# Patient Record
Sex: Female | Born: 1995 | Race: Black or African American | Hispanic: No | Marital: Single | Smoking: Never smoker
Health system: Southern US, Community
[De-identification: ages and names within clinical notes are randomized; demographics above are authoritative.]

---

## 2014-07-09 ENCOUNTER — Emergency Department (HOSPITAL_COMMUNITY)
Admission: EM | Admit: 2014-07-09 | Discharge: 2014-07-09 | Disposition: A | Discharge status: Home / Self Care | Payer: BC Managed Care – PPO | Attending: Emergency Medicine | Admitting: Emergency Medicine

## 2014-07-09 ENCOUNTER — Encounter (HOSPITAL_COMMUNITY): Payer: Self-pay | Admitting: Emergency Medicine

## 2014-07-09 ENCOUNTER — Emergency Department (HOSPITAL_COMMUNITY): Payer: BC Managed Care – PPO

## 2014-07-09 DIAGNOSIS — S161XXA Strain of muscle, fascia and tendon at neck level, initial encounter: Secondary | ICD-10-CM | POA: Diagnosis not present

## 2014-07-09 DIAGNOSIS — Y92410 Unspecified street and highway as the place of occurrence of the external cause: Secondary | ICD-10-CM | POA: Insufficient documentation

## 2014-07-09 DIAGNOSIS — Y9389 Activity, other specified: Secondary | ICD-10-CM | POA: Insufficient documentation

## 2014-07-09 DIAGNOSIS — S39012A Strain of muscle, fascia and tendon of lower back, initial encounter: Secondary | ICD-10-CM

## 2014-07-09 DIAGNOSIS — S199XXA Unspecified injury of neck, initial encounter: Secondary | ICD-10-CM | POA: Diagnosis present

## 2014-07-09 MED ORDER — HYDROCODONE-ACETAMINOPHEN 5-325 MG PO TABS: 2.0000 | ORAL_TABLET | ORAL | Status: AC | PRN

## 2014-07-09 MED ORDER — METAXALONE 800 MG PO TABS: 800.0000 mg | ORAL_TABLET | Freq: Three times a day (TID) | ORAL | Status: AC | PRN

## 2014-07-09 MED ORDER — IBUPROFEN 600 MG PO TABS: 600.0000 mg | ORAL_TABLET | Freq: Four times a day (QID) | ORAL | Status: AC | PRN
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
## 2014-07-09 NOTE — Discharge Instructions (Signed)
° °Cervical Sprain °A cervical sprain is an injury in the neck in which the strong, fibrous tissues (ligaments) that connect your neck bones stretch or tear. Cervical sprains can range from mild to severe. Severe cervical sprains can cause the neck vertebrae to be unstable. This can lead to damage of the spinal cord and can result in serious nervous system problems. The amount of time it takes for a cervical sprain to get better depends on the cause and extent of the injury. Most cervical sprains heal in 1 to 3 weeks. °CAUSES  °Severe cervical sprains may be caused by:  °· Contact sport injuries (such as from football, rugby, wrestling, hockey, auto racing, gymnastics, diving, martial arts, or boxing).   °· Motor vehicle collisions.   °· Whiplash injuries. This is an injury from a sudden forward and backward whipping movement of the head and neck.  °· Falls.   °Mild cervical sprains may be caused by:  °· Being in an awkward position, such as while cradling a telephone between your ear and shoulder.   °· Sitting in a chair that does not offer proper support.   °· Working at a poorly designed computer station.   °· Looking up or down for long periods of time.   °SYMPTOMS  °· Pain, soreness, stiffness, or a burning sensation in the front, back, or sides of the neck. This discomfort may develop immediately after the injury or slowly, 24 hours or more after the injury.   °· Pain or tenderness directly in the middle of the back of the neck.   °· Shoulder or upper back pain.   °· Limited ability to move the neck.   °· Headache.   °· Dizziness.   °· Weakness, numbness, or tingling in the hands or arms.   °· Muscle spasms.   °· Difficulty swallowing or chewing.   °· Tenderness and swelling of the neck.   °DIAGNOSIS  °Most of the time your health care provider can diagnose a cervical sprain by taking your history and doing a physical exam. Your health care provider will ask about previous neck injuries and any known neck  problems, such as arthritis in the neck. X-rays may be taken to find out if there are any other problems, such as with the bones of the neck. Other tests, such as a CT scan or MRI, may also be needed.  °TREATMENT  °Treatment depends on the severity of the cervical sprain. Mild sprains can be treated with rest, keeping the neck in place (immobilization), and pain medicines. Severe cervical sprains are immediately immobilized. Further treatment is done to help with pain, muscle spasms, and other symptoms and may include: °· Medicines, such as pain relievers, numbing medicines, or muscle relaxants.   °· Physical therapy. This may involve stretching exercises, strengthening exercises, and posture training. Exercises and improved posture can help stabilize the neck, strengthen muscles, and help stop symptoms from returning.   °HOME CARE INSTRUCTIONS  °· Put ice on the injured area.   °· Put ice in a plastic bag.   °· Place a towel between your skin and the bag.   °· Leave the ice on for 15-20 minutes, 3-4 times a day.   °· If your injury was severe, you may have been given a cervical collar to wear. A cervical collar is a two-piece collar designed to keep your neck from moving while it heals. °· Do not remove the collar unless instructed by your health care provider. °· If you have long hair, keep it outside of the collar. °· Ask your health care provider before making any adjustments to your collar.   Minor adjustments may be required over time to improve comfort and reduce pressure on your chin or on the back of your head. °· If you are allowed to remove the collar for cleaning or bathing, follow your health care provider's instructions on how to do so safely. °· Keep your collar clean by wiping it with mild soap and water and drying it completely. If the collar you have been given includes removable pads, remove them every 1-2 days and hand wash them with soap and water. Allow them to air dry. They should be completely  dry before you wear them in the collar. °· If you are allowed to remove the collar for cleaning and bathing, wash and dry the skin of your neck. Check your skin for irritation or sores. If you see any, tell your health care provider. °· Do not drive while wearing the collar.   °· Only take over-the-counter or prescription medicines for pain, discomfort, or fever as directed by your health care provider.   °· Keep all follow-up appointments as directed by your health care provider.   °· Keep all physical therapy appointments as directed by your health care provider.   °· Make any needed adjustments to your workstation to promote good posture.   °· Avoid positions and activities that make your symptoms worse.   °· Warm up and stretch before being active to help prevent problems.   °SEEK MEDICAL CARE IF:  °· Your pain is not controlled with medicine.   °· You are unable to decrease your pain medicine over time as planned.   °· Your activity level is not improving as expected.   °SEEK IMMEDIATE MEDICAL CARE IF:  °· You develop any bleeding. °· You develop stomach upset. °· You have signs of an allergic reaction to your medicine.   °· Your symptoms get worse.   °· You develop new, unexplained symptoms.   °· You have numbness, tingling, weakness, or paralysis in any part of your body.   °MAKE SURE YOU:  °· Understand these instructions. °· Will watch your condition. °· Will get help right away if you are not doing well or get worse. °Document Released: 06/20/2007 Document Revised: 08/28/2013 Document Reviewed: 02/28/2013 °[employer]® Patient Information ©2015 [employer]. This information is not intended to replace advice given to you by your health care provider. Make sure you discuss any questions you have with your health care provider. °Lumbosacral Strain °Lumbosacral strain is a strain of any of the parts that make up your lumbosacral vertebrae. Your lumbosacral vertebrae are the bones that make up the lower third of  your backbone. Your lumbosacral vertebrae are held together by muscles and tough, fibrous tissue (ligaments).  °CAUSES  °A sudden blow to your back can cause lumbosacral strain. Also, anything that causes an excessive stretch of the muscles in the low back can cause this strain. This is typically seen when people exert themselves strenuously, fall, lift heavy objects, bend, or crouch repeatedly. °RISK FACTORS °· Physically demanding work. °· Participation in pushing or pulling sports or sports that require a sudden twist of the back (tennis, golf, baseball). °· Weight lifting. °· Excessive lower back curvature. °· Forward-tilted pelvis. °· Weak back or abdominal muscles or both. °· Tight hamstrings. °SIGNS AND SYMPTOMS  °Lumbosacral strain may cause pain in the area of your injury or pain that moves (radiates) down your leg.  °DIAGNOSIS °Your health care provider can often diagnose lumbosacral strain through a physical exam. In some cases, you may need tests such as X-ray exams.  °TREATMENT  °Treatment for your lower   back injury depends on many factors that your clinician will have to evaluate. However, most treatment will include the use of anti-inflammatory medicines. °HOME CARE INSTRUCTIONS  °· Avoid hard physical activities (tennis, racquetball, waterskiing) if you are not in proper physical condition for it. This may aggravate or create problems. °· If you have a back problem, avoid sports requiring sudden body movements. Swimming and walking are generally safer activities. °· Maintain good posture. °· Maintain a healthy weight. °· For acute conditions, you may put ice on the injured area. °· Put ice in a plastic bag. °· Place a towel between your skin and the bag. °· Leave the ice on for 20 minutes, 2-3 times a day. °· When the low back starts healing, stretching and strengthening exercises may be recommended. °SEEK MEDICAL CARE IF: °· Your back pain is getting worse. °· You experience severe back pain not  relieved with medicines. °SEEK IMMEDIATE MEDICAL CARE IF:  °· You have numbness, tingling, weakness, or problems with the use of your arms or legs. °· There is a change in bowel or bladder control. °· You have increasing pain in any area of the body, including your belly (abdomen). °· You notice shortness of breath, dizziness, or feel faint. °· You feel sick to your stomach (nauseous), are throwing up (vomiting), or become sweaty. °· You notice discoloration of your toes or legs, or your feet get very cold. °MAKE SURE YOU:  °· Understand these instructions. °· Will watch your condition. °· Will get help right away if you are not doing well or get worse. °Document Released: 06/02/2005 Document Revised: 08/28/2013 Document Reviewed: 04/11/2013 °ExitCare® Patient Information ©2015 [employer]. This information is not intended to replace advice given to you by your health care provider. Make sure you discuss any questions you have with your health care provider. °Motor Vehicle Collision °It is common to have multiple bruises and sore muscles after a motor vehicle collision (MVC). These tend to feel worse for the first 24 hours. You may have the most stiffness and soreness over the first several hours. You may also feel worse when you wake up the first morning after your collision. After this point, you will usually begin to improve with each day. The speed of improvement often depends on the severity of the collision, the number of injuries, and the location and nature of these injuries. °HOME CARE INSTRUCTIONS °· Put ice on the injured area. °¨ Put ice in a plastic bag. °¨ Place a towel between your skin and the bag. °¨ Leave the ice on for 15-20 minutes, 3-4 times a day, or as directed by your health care provider. °· Drink enough fluids to keep your urine clear or pale yellow. Do not drink alcohol. °· Take a warm shower or bath once or twice a day. This will increase blood flow to sore muscles. °· You may return to  activities as directed by your caregiver. Be careful when lifting, as this may aggravate neck or back pain. °· Only take over-the-counter or prescription medicines for pain, discomfort, or fever as directed by your caregiver. Do not use aspirin. This may increase bruising and bleeding. °SEEK IMMEDIATE MEDICAL CARE IF: °· You have numbness, tingling, or weakness in the arms or legs. °· You develop severe headaches not relieved with medicine. °· You have severe neck pain, especially tenderness in the middle of the back of your neck. °· You have changes in bowel or bladder control. °· There is increasing pain   in any area of the body. °· You have shortness of breath, light-headedness, dizziness, or fainting. °· You have chest pain. °· You feel sick to your stomach (nauseous), throw up (vomit), or sweat. °· You have increasing abdominal discomfort. °· There is blood in your urine, stool, or vomit. °· You have pain in your shoulder (shoulder strap areas). °· You feel your symptoms are getting worse. °MAKE SURE YOU: °· Understand these instructions. °· Will watch your condition. °· Will get help right away if you are not doing well or get worse. °Document Released: 08/23/2005 Document Revised: 01/07/2014 Document Reviewed: 01/20/2011 °Exit[employer]® Patient Information ©2015 [employer]. This information is not intended to replace advice given to you by your health care provider. Make sure you discuss any questions you have with your health care provider. ° ° °

## 2014-07-09 NOTE — ED Notes (Signed)
Patient transported to CT 
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
